# Patient Record
Sex: Male | Born: 1935 | Race: White | Hispanic: No | Marital: Married | Smoking: Former smoker
Health system: Southern US, Community
[De-identification: ages and names within clinical notes are randomized; demographics above are authoritative.]

## PROBLEM LIST (undated history)

## (undated) DIAGNOSIS — I499 Cardiac arrhythmia, unspecified: Secondary | ICD-10-CM

## (undated) DIAGNOSIS — R233 Spontaneous ecchymoses: Secondary | ICD-10-CM

## (undated) DIAGNOSIS — Z972 Presence of dental prosthetic device (complete) (partial): Secondary | ICD-10-CM

## (undated) DIAGNOSIS — R238 Other skin changes: Secondary | ICD-10-CM

## (undated) DIAGNOSIS — H35341 Macular cyst, hole, or pseudohole, right eye: Secondary | ICD-10-CM

## (undated) DIAGNOSIS — J449 Chronic obstructive pulmonary disease, unspecified: Secondary | ICD-10-CM

## (undated) DIAGNOSIS — M1990 Unspecified osteoarthritis, unspecified site: Secondary | ICD-10-CM

## (undated) HISTORY — PX: TOTAL KNEE ARTHROPLASTY: SHX125

## (undated) HISTORY — PX: CATARACT EXTRACTION: SUR2

## (undated) HISTORY — PX: PARS PLANA VITRECTOMY W/ REPAIR OF MACULAR HOLE: SHX2170

## (undated) HISTORY — DX: Presence of dental prosthetic device (complete) (partial): Z97.2

## (undated) HISTORY — DX: Other skin changes: R23.8

## (undated) HISTORY — DX: Chronic obstructive pulmonary disease, unspecified: J44.9

## (undated) HISTORY — DX: Macular cyst, hole, or pseudohole, right eye: H35.341

## (undated) HISTORY — PX: BACK SURGERY: SHX140

## (undated) HISTORY — DX: Spontaneous ecchymoses: R23.3

## (undated) HISTORY — PX: EYE SURGERY: SHX253

## (undated) HISTORY — DX: Unspecified osteoarthritis, unspecified site: M19.90

---

## 2001-08-20 ENCOUNTER — Encounter: Admission: RE | Admit: 2001-08-20 | Discharge: 2001-08-20 | Payer: Self-pay | Admitting: Urology

## 2001-08-20 ENCOUNTER — Encounter: Payer: Self-pay | Admitting: Urology

## 2001-08-26 ENCOUNTER — Ambulatory Visit (HOSPITAL_BASED_OUTPATIENT_CLINIC_OR_DEPARTMENT_OTHER): Admission: RE | Admit: 2001-08-26 | Discharge: 2001-08-26 | Payer: Self-pay | Admitting: Urology

## 2001-08-26 ENCOUNTER — Encounter: Payer: Self-pay | Admitting: Urology

## 2001-08-28 ENCOUNTER — Emergency Department (HOSPITAL_COMMUNITY): Admission: EM | Admit: 2001-08-28 | Discharge: 2001-08-28 | Payer: Self-pay | Admitting: Emergency Medicine

## 2001-10-19 ENCOUNTER — Ambulatory Visit (HOSPITAL_COMMUNITY): Admission: RE | Admit: 2001-10-19 | Discharge: 2001-10-20 | Payer: Self-pay | Admitting: Ophthalmology

## 2003-08-03 ENCOUNTER — Emergency Department (HOSPITAL_COMMUNITY): Admission: EM | Admit: 2003-08-03 | Discharge: 2003-08-04 | Payer: Self-pay | Admitting: Emergency Medicine

## 2003-08-03 ENCOUNTER — Ambulatory Visit (HOSPITAL_COMMUNITY): Admission: RE | Admit: 2003-08-03 | Discharge: 2003-08-03 | Payer: Self-pay | Admitting: Urology

## 2003-08-15 ENCOUNTER — Ambulatory Visit (HOSPITAL_BASED_OUTPATIENT_CLINIC_OR_DEPARTMENT_OTHER): Admission: RE | Admit: 2003-08-15 | Discharge: 2003-08-15 | Payer: Self-pay | Admitting: Urology

## 2004-01-25 ENCOUNTER — Ambulatory Visit (HOSPITAL_COMMUNITY): Admission: RE | Admit: 2004-01-25 | Discharge: 2004-01-26 | Payer: Self-pay | Admitting: Neurological Surgery

## 2004-01-26 ENCOUNTER — Emergency Department (HOSPITAL_COMMUNITY): Admission: EM | Admit: 2004-01-26 | Discharge: 2004-01-26 | Payer: Self-pay | Admitting: Emergency Medicine

## 2005-01-24 ENCOUNTER — Ambulatory Visit (HOSPITAL_COMMUNITY): Admission: RE | Admit: 2005-01-24 | Discharge: 2005-01-24 | Payer: Self-pay | Admitting: Ophthalmology

## 2006-07-30 ENCOUNTER — Ambulatory Visit: Admission: RE | Admit: 2006-07-30 | Discharge: 2006-07-30 | Payer: Self-pay | Admitting: Family Medicine

## 2008-04-08 ENCOUNTER — Emergency Department (HOSPITAL_COMMUNITY): Admission: EM | Admit: 2008-04-08 | Discharge: 2008-04-08 | Payer: Self-pay | Admitting: Emergency Medicine

## 2008-04-08 ENCOUNTER — Ambulatory Visit: Payer: Self-pay | Admitting: Surgery

## 2008-04-08 ENCOUNTER — Encounter (INDEPENDENT_AMBULATORY_CARE_PROVIDER_SITE_OTHER): Payer: Self-pay | Admitting: Emergency Medicine

## 2008-05-03 ENCOUNTER — Inpatient Hospital Stay (HOSPITAL_COMMUNITY): Admission: RE | Admit: 2008-05-03 | Discharge: 2008-05-09 | Payer: Self-pay | Admitting: Orthopedic Surgery

## 2008-05-29 ENCOUNTER — Encounter: Admission: RE | Admit: 2008-05-29 | Discharge: 2008-06-22 | Payer: Self-pay | Admitting: Orthopedic Surgery

## 2010-04-20 ENCOUNTER — Encounter: Admission: RE | Admit: 2010-04-20 | Discharge: 2010-04-20 | Payer: Self-pay | Admitting: Otolaryngology

## 2011-01-14 NOTE — Op Note (Signed)
[name], [name]                 ACCOUNT NO.:  [id number]   MEDICAL RECORD NO.:  [id number]          PATIENT TYPE:  INP   LOCATION:  1616                         FACILITY:  [facility]   PHYSICIAN:  [name], M.D.    DATE OF BIRTH:  08-11-1931   DATE OF PROCEDURE:  05/03/2008  DATE OF DISCHARGE:                               OPERATIVE REPORT   PREOPERATIVE DIAGNOSIS:  Osteoarthritis right knee.   POSTOPERATIVE DIAGNOSIS:  Osteoarthritis right knee.   PROCEDURE:  Right total knee arthroplasty.   SURGEON:  [name], M.D.   ASSISTANT:  [name], P.A.C.   ANESTHESIA:  General with postop Marcaine pain pump.   ESTIMATED BLOOD LOSS:  Minimal.   DRAIN:  Hemovac times one.   TOURNIQUET TIME:  31 minutes at 300 mmHg.   COMPLICATIONS:  None.   CONDITION:  Stable to recovery room.   BRIEF CLINICAL NOTE:  [name] is a 77 year old male with severe end-  stage arthritis of the right knee with progressively worsening pain and  rapid decrease in function.  He has failed nonoperative management and  presents for total knee arthroplasty.   PROCEDURE IN DETAIL:  After the successful administration of general  anesthetic a tourniquet was placed high on his right thigh and right  lower extremity prepped and draped in the usual sterile fashion.  Extremity was wrapped in Esmarch, knee flexed and tourniquet inflated to  300 mmHg.  Midline incision was made with a 10 blade through  subcutaneous tissue to the level of the extensor mechanism.  A fresh  blade was used to make a medial parapatellar arthrotomy.  Soft tissue  over the proximal medial tibia subperiosteally elevated to the joint  line with the knife and to the semimembranosus bursa with a Cobb  elevator.  Soft tissue laterally is elevated with attention being paid  to avoid the patellar tendon on tibial tubercle.  The patella subluxed  laterally, knee flexed 90 degrees, ACL and PCL removed.  Drill was used  to  create a starting hole in the distal femur and the canal was  thoroughly irrigated.  The 5 degree right valgus alignment guide is  placed referencing off the posterior condyles.  Rotation was marked and  the block pinned to remove 11 mm off the distal femur.  I took 11  because of preoperative flexion contracture.  Distal femoral resection  is made with an oscillating saw.  Sizing block was placed, size 5 is the  most appropriate.  Rotation was marked off the epicondylar axis.  Size 5  cutting block is placed and the anterior posterior and chamfer cuts  made.   The tibia was subluxed forward and the menisci removed.  Extramedullary  tibial alignment guide is placed referencing proximally at the medial  aspect of the tibial tubercle and distally along the second metatarsal  axis and tibial crest.  The block is pinned to remove about 2 mm off the  more deficient medial side.  The resection is made with an oscillating  saw.  Size 4 was the most appropriate tibial  size and the proximal tibia  prepared with the modular drill and keel punch for a size 4.  Femoral  preparation is completed with the intercondylar cut for the size 5.   Size 4 mobile bearing tibial trial, size 5 posterior stabilized femoral  trial and a 12.5 mm posterior stabilized rotating platform insert trial  are placed.  With the 12.5 there was a little bit of varus-valgus play  and I went to 15 which allowed for full extension with excellent varus-  valgus anterior and posterior stability throughout full range of motion.  The patella was then everted, thickness measured to be 27 mm.  Freehand  resection taken to 15 mm, 41 template is placed, lug holes were drilled,  trial patella was placed and it tracks normally.  Osteophytes removed  off the posterior femur with the trial in place.  All trials were  removed and the cut bone surfaces are prepared with pulsatile lavage.  Cement was mixed and once ready for implantation the  size 4 mobile  bearing tibial tray, size 5 posterior stabilized femur and the 41  patella were cemented into place.  The patella is held with a clamp.  Trial 15 mm insert was placed and knee held in full extension and all  extruded cement removed.  When the cement had fully hardened then the  permanent 15 mm posterior stabilized rotating platform insert is placed  into the tibial tray.  Wound was copiously irrigated with saline  solution and a thorough synovectomy performed.  FloSeal was injected on  the posterior capsule, medial and lateral gutters and suprapatellar  area.  Moist sponge is placed and tourniquet released for a total time  of 31 minutes.  The sponge was held for 2 minutes then removed.  There  is a slight amount of bleeding and bleeding points were stopped with  electrocautery.  Due to the hypertrophic synovitis I felt that he would  be at risk for some drainage postop so we placed a Hemovac drain and  closed the arthrotomy with interrupted #1 Vicryl.  Flexion against  gravity is 135 degrees.  Subcu was closed with interrupted 2-0 Vicryl  and subcuticular running 4-0 Monocryl.  Catheter for the Marcaine pain  pump is placed and the pump was initiated.  Steri-Strips and a bulky  sterile dressing are applied and he is placed into a knee immobilizer,  awakened and transported to recovery in stable condition.      [name], M.D.  Electronically Signed     FA/MEDQ  D:  05/03/2008  T:  05/04/2008  Job:  [id number]

## 2011-01-14 NOTE — H&P (Signed)
NAMEDOHN, STCLAIR NO.:  [id number]   MEDICAL RECORD NO.:  [id number]          PATIENT TYPE:  INP   LOCATION:                               FACILITY:  [facility]   PHYSICIAN:  Ollen Gross, M.D.    DATE OF BIRTH:  1930/12/23   DATE OF ADMISSION:  05/03/2008  DATE OF DISCHARGE:                              HISTORY & PHYSICAL   CHIEF COMPLAINT:  Right knee pain.   HISTORY OF PRESENT ILLNESS:  The patient is 77 year old male that has  been seen by Dr. Lequita Halt for evaluation of his right knee.  He has had  severe right knee pain for several months now. He is found in the office  to have severe end-stage arthritis, tricompartmental the right knee,  felt to be a good candidate.  Risks and benefits have been discussed.  He has been seen preoperatively by Dr. Jeannetta Nap and felt cleared for  surgery.   ALLERGIES:  SULFA DRUGS.   CURRENT MEDICATIONS:  Motrin, hydrocodone, Symbicort, Protonix.   PAST MEDICAL HISTORY:  Vertigo, macular degeneration, cataracts, severe  COPD, hemorrhoids, history of renal calculi, and childhood illnesses of  measles and mumps.   PAST SURGICAL HISTORY:  Macular hole surgery, back surgery, cataract  surgery, lithotripsy, cystoscopy, cartilage knee surgery.   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  Lives with family.  Wife will be assisting care after  surgery.   FAMILY HISTORY:  Father deceased, old age, 52.  Mother deceased of old  age 55.   REVIEW OF SYSTEMS:  GENERAL:  No fevers, chills or night sweats.  NEURO:  Little bit of blurred vision.  No seizures, syncope or paralysis.  RESPIRATORY: Severe COPD.  No shortness of breath at rest.  No  productive cough.  CARDIOVASCULAR: No chest pain, orthopnea.  GI: No  nausea, vomiting, diarrhea or constipation.  GU: A little bit of  nocturia.  No dysuria, hematuria.  MUSCULOSKELETAL: Right knee.   PHYSICAL EXAMINATION:  VITAL SIGNS: Pulse 76,  respirations 16, blood  pressure 144/72.  GENERAL:  A 76-year white male, well nourished, well developed, slightly  overweight, no acute distress, alert, oriented, and cooperative.  HEENT: Normocephalic, atraumatic.  Pupils are reactive.  Oropharynx  clear.  EOMs intact.  NECK:  Supple.  No bruits.  CHEST:  Faint, inspiratory wheeze, right base, otherwise clear.  HEART: Regular rate and rhythm.  No murmur, S1, S2 noted.  ABDOMEN:  Soft, nontender.  Bowel sounds present.  RECTAL/BREAST/GENITALIA:  Not done not per his present illness.  EXTREMITIES:  Right knee moderate effusion, 10-115 moderate crepitus,  tender more medial than lateral.   IMPRESSION:  Osteoarthritis right knee.   PLAN:  The patient was admitted to the hospital to undergo a right total  knee surgery.  It is to be performed by Dr. Ollen Gross.      [name], P.A.C.      Ollen Gross, M.D.  Electronically Signed    ALP/MEDQ  D:  05/02/2008  T:  05/03/2008  Job:  [id number]  cc:   [name], M.D.  Fax: [phone]   Aundra Dubin, M.D.  [address]  [place]  [place] [address]

## 2011-01-14 NOTE — Discharge Summary (Signed)
[name], [name]                 ACCOUNT NO.:  [id number]   MEDICAL RECORD NO.:  [id number]          PATIENT TYPE:  INP   LOCATION:  1539                         FACILITY:  [facility]   PHYSICIAN:  Ollen Gross, M.D.    DATE OF BIRTH:  20-Apr-1931   DATE OF ADMISSION:  05/03/2008  DATE OF DISCHARGE:  05/09/2008                               DISCHARGE SUMMARY   ADMISSION DIAGNOSES:  1. Osteoarthritis right knee.  2. Vertigo.  3. Macular degeneration.  4. Cataracts.  5. Severe chronic obstructive pulmonary disease.  6. Hemorrhoids.  7. History of renal calculi.  8. Childhood illnesses to include measles and mumps.   DISCHARGE DIAGNOSES:  1. Osteoarthritis right knee status post right total knee replacement      arthroplasty.  2. Postop blood loss anemia.  Did not require transfusion.  3. Mild postop hyponatremia.  4. Ear pain, likely due to otitis infection.  5. Vertigo.  6. Macular degeneration.  7. Cataracts.  8. Severe chronic obstructive pulmonary disease.  9. Hemorrhoids.  10.History of renal calculi.  11.Childhood illnesses to include measles and mumps.   PROCEDURE:  May 03, 2008 right total knee.  Surgeon, Dr. Lequita Halt.  Assistant, Ricardo Peace, PA-C.   ANESTHESIA:  General.   CONSULTANT:  [facility] Dr. Tamsen Roers.   BRIEF HISTORY:  [name] is a 77 year old male with severe end-stage  arthritis of the right knee with progressive worsening pain, rapid  decrease in function, operative management now presents for a total knee  arthroplasty.   LABORATORY DATA:  CBC showed hemoglobin of 14.7, hematocrit of 44.7,  white cell count 9.3, platelets 376,000.  Chem panel on admission all  within normal limits.  PT/INR 13.4 with a PTT of 28.  Preop UA was  negative.  Serial CBCs were followed.   HOSPITAL COURSE:  Hemoglobin did drop down to 12 and then 9.2.  Last H&H  was 8.6 and 25.8.  Serial pro time followed.  PT/INR 27.6 and 2.4.  Serial BMET followed.  Sodium did  drop a little bit down to 139 and got  as low as 133, just below the normal, likely due to the dilutional  component from the fluids.   X-RAYS:  Chest x-ray aut 25, 2009 no active disease.  He had a CT scan  maxillofacial without contrast, unremarkable CT examination of the  facial bones.  EKG April 25, 2008 normal sinus rhythm, sinus  arrhythmia, normal EKG, unconfirmed.   HOSPITAL COURSE:  The patient was admitted to [facility] and  tolerated seizure well, later transferred to recovery then the  orthopedic floor.  Started on PCA and p.o. analgesic pain control  following surgery, doing pretty well on the morning of day one.  He was  noted to have a lot of inflammatory disease in the knee and actually  felt a little bit better on day one.  Hemoglobin was stable.  Had decent  output, monitored his Is and Os.  He had severe COPD with his O2 sats  looked good on day 1, started getting up out of bed.  By day 2 he was up  walking about 40 feet.  He had been sleeping okay.  Seen on the morning  of day 2 he asked to leave the Foley a little bit longer so we left it  in until that evening.  DC the PCA.  He was having trouble starting with  flatus and moving his bowels so put on a little low dose Reglan and also  tried suppositories.  Hemoglobin is down to 9.8 a little bit.  Was  asymptomatic with this so we added iron.  He had a little bit low sodium  so we DC the fluids.  This was felt to be due to just some delusional  component, let him concentrate back up.  On the evening of day 2 he had  been complaining of some ear pain.  He had a general anesthesia and a  little bit of sore throat, and we felt that sore throat was causing a  little bit of swelling and negative pressure.  We put him on a little  bit of decongestant also Cepacol lozenges and throat sprays.  The pain  became quite severe in the ears, so with possibility of having some  irritation in his ear, we put him on a  little bit of topical analgesic  drops for his ears, which did help temporarily.  He was seen on the  morning of day 3 and his main complaint was just the ear pain.  He did  have a bowel movement with the medications we had started him on.  Rechecked his white count the following day.  White count was up a  little bit to 17.4 and then came back down to 11 and even further down  to 10.6.  Due to continued ear pain over the weekend, consult was called  in and the patient was seen by Dr. Tamsen Roers for [facility] on September 6,  over the weekend.  Started him on IV Rocephin and put him on p.o.  Augmentin for coverage.  Due to the G pain that was extending from the  ear to the jaw, it was painful for him to open up his jaw, so they  wanted to rule out any kind of TMJ pathology.  CT scan was ordered.  CT  scan was negative, unremarkable for any of the facial bones and TMJ  pathology.  After the Rocephin and p.o. antibiotics, he was doing better  with his ear pain which continued to improve over the weekend.  Continued to receive therapy.  He is up walking about 125 feet by postop  day #5.  He was seen on rounds on postop day #6 of May 09, 2008,  seen by Dr. Lequita Halt.  His ear pain was better.  Leg was doing okay.  Was  up ambulating and it was decided the patient could be discharged home at  that time.  We would send home on antibiotics.   DISCHARGE/PLAN:  1. The patient discharged home on May 09, 2008.  2. Discharge diagnoses, please see above.  3. Discharge meds, Coumadin, Nu-Iron, Percocet, Robaxin and Augmentin.  4. Diet as tolerated.  5. Activities:  Weightbearing as tolerated to the right lower      extremity.  Gait training, ambulation, and ADLs as per PT for home      therapy. Range of motion strengthening exercises.  Follow up in 2      weeks.   DISPOSITION:  Home.   CONDITION ON DISCHARGE:  Slowly  improving.      [name], P.A.C.      Ollen Gross,  M.D.  Electronically Signed    ALP/MEDQ  D:  05/09/2008  T:  05/09/2008  Job:  [id number]   cc:   Ollen Gross, M.D.  Fax: [phone]   [name], M.D.  Fax: [phone]   Aundra Dubin, M.D.  [address]  [place]  [place] [address]   Beckey Rutter, MD

## 2011-01-14 NOTE — Consult Note (Signed)
NAMEHERVEY, [name]                 ACCOUNT NO.:  [id number]   MEDICAL RECORD NO.:  [id number]          PATIENT TYPE:  INP   LOCATION:  1539                         FACILITY:  [facility]   PHYSICIAN:  Beckey Rutter, MD  DATE OF BIRTH:  12-Jul-1931   DATE OF CONSULTATION:  DATE OF DISCHARGE:                                 CONSULTATION   REFERRING PHYSICIAN:  Primary orthopedic physician is Ollen Gross,  M.D.  Consultation was called by Dr. Lequita Halt.   REASON FOR CONSULTATION:  Left ear pain with inability to open mouth.   HISTORY OF PRESENT ILLNESS:  This is a 77 year old pleasant Caucasian  male with past medical history significant for COPD, renal calculi,  right knee osteoarthropathy, status post knee replacement, and  hemorrhoids, who was complaining of left ear pain and pain whenever he  opens his mouth since Friday.  Friday the pain was so severe that he  could barely eat.  The pain has eased the next day but today the pain is  back to become severe again.  He denied any ear discharge but he  admitted to having some tenderness when he touched his left ear.  He  also has tenderness whenever he opens his mouth and that limits his p.o.  intake greatly.  No fever and no left ear discharge.   PAST MEDICAL HISTORY:  1. Osteoarthritis on right knee.  2. Renal calculi.  3. COPD.  4. Hemorrhoids.   MEDICATION ALLERGIES:  SULFA.   CURRENT MEDICATIONS:  1. Cymbalta b.i.d.  2. Intravenous fluid D5 with NS.  3. Colace 100 mg b.i.d.  4. Nu-Iron 150 mg p.o. daily.  5. Protonix 40 mg b.i.d.  6. Coumadin 1 mg p.o. once.  7. Auralgan ear drops.  8. Tylenol 325 mg p.r.n.  9. Cepacol 1 __________ p.r.n.  10.Robaxin 500 mg p.o. q.6 h. p.r.n.  11.Zofran 4 mg IV q.6 h. p.r.n.  12.Percocet 1-2 tabs p.o. q.4 h. p.r.n.  13.Phenergan 25 mg p.o. q.6 h. and 12.5 mg IV q.6 h. p.r.n.  14.Ambien 5 mg p.o. q.h.s.   SOCIAL HISTORY:  Married, Medical illustrator.  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  No  alcohol abuse.  Lives with his family.   FAMILY HISTORY:  Father deceased at age or 56.  Mother deceased at age  of 27.   REVIEW OF SYSTEMS:  No fever, no discharge.  No dizziness or confusion.  The rest of the review of systems is unremarkable.   EXAMINATION:  Vitals showing 97.5, pulse 94, respiratory rate of 16,  blood pressure 136/69, and oxygen saturation 96.  Head atraumatic, normocephalic.  Left ear is tender to palpation.  Point  of maximum tenderness in the left tragus area.  With otoscope, there is  no significant discharge.  There is no significant bulging of the  eardrum, but there is a redness in the external meatus.  There is  tenderness over the temporomandibular joint area and slight tenderness  on the left mastoid and the left sternoclavicular muscle before the  attachment to the left mastoid.  Otherwise the neck is  supple.  He is able to move it from one side to  other side.  LUNGS:  Bilateral fair air entry.  PRECORDIUM:  First and second heart sound audible, no other sound.  ABDOMEN:  Soft, nontender.  Bowel sounds present.  EXTREMITIES:  No lower extremity edema.  NEUROLOGIC:  Alert and oriented x3.  Moving his extremities  spontaneously.   LABORATORY DATA:  His INR is 2.8.  CBC is showing white blood count is  11.4, no differential, hemoglobin 8.4, hematocrit is 25.1 and platelet  count is 299.  Chest x-ray on April 25, 2008, was showing no acute  disease.   ASSESSMENT AND PLAN:  This is a 77 year old with left temporomandibular  pain with movement and left ear pain.  This could be secondary to  temporomandibular joint pathology but it could be also simple otitis  media with complication.  There is no fever and no significant white  blood count.  Nevertheless, I think the patient would benefit from  antibiotic and ENT consultation.   So the suggestion is:  1. Antibiotic Rocephin with a stat dose.  2. CT head with extension toward the neck to cover the  TMJs and the      mastoids.  3. ENT consultation.  4. The patient is on Coumadin.  I would agree with discharging the      patient on Coumadin.  5. Anemia.  I am not sure why the patient is anemic.  Currently I      would recommend anemia panel.  The patient is already taking Nu-      Iron.   Thank you for the consultation.  We will follow through with you.      Beckey Rutter, MD  Electronically Signed     EME/MEDQ  D:  05/07/2008  T:  05/08/2008  Job:  [id number]

## 2011-01-17 ENCOUNTER — Other Ambulatory Visit: Payer: Self-pay | Admitting: Family Medicine

## 2011-01-17 DIAGNOSIS — R109 Unspecified abdominal pain: Secondary | ICD-10-CM

## 2011-01-17 NOTE — Op Note (Signed)
NAMETREYSON, AXEL NO.:  [id number]   MEDICAL RECORD NO.:  [id number]          PATIENT TYPE:  OIB   LOCATION:  2899                         FACILITY:  [facility]   PHYSICIAN:  Guadelupe Sabin, M.D.DATE OF BIRTH:  June 17, 1931   DATE OF PROCEDURE:  01/24/2005  DATE OF DISCHARGE:                                 OPERATIVE REPORT   PREOPERATIVE DIAGNOSES:  1.  Retained intraocular foreign body (silicone oil), right eye.  2.  Macular hole, right eye.  3.  Pseudophakia, right eye.   POSTOPERATIVE DIAGNOSES:  1.  Retained intraocular foreign body (silicone oil), right eye.  2.  Macular hole, right eye.  3.  Pseudophakia, right eye.   NAME OF OPERATION:  Pars plana vitrectomy using vitreous infusion suction  cutter.   SURGEON:  Guadelupe Sabin, M.D.   ASSISTANT:  Nurse.   ANESTHESIA:  Local 4% Xylocaine, 0.75% Marcaine retrobulbar block, topical  tetracaine, intraocular Xylocaine.   OPERATIVE PROCEDURE:  After the patient was prepped and draped, a lid  speculum was inserted in the right eye.  The eye was turned downward and a  superior rectus traction suture placed.  A peritomy was performed adjacent  to the limbus from the  8 to 2 o'clock position.  Three sclerotomy sites  were prepared 3 mm from the limbus using the 19-gauge MVR blade.  The  vitreous infusion terminal, 4-mm length, was instilled at the 8 o'clock  position and held in placed with a 5-0 green mattress Dacron suture.  The  fiberoptic light pipe was inserted at the 2 o'clock position and the  handpiece of the vitreous infusion suction cutter at the 10 o'clock  position.  Slow vitreous infusion and suction cutting were begun and the eye  allowed to irrigate with the infusing balanced salt solution.  Indirect  ophthalmoscopy failed to reveal the large previous bubble of silicone noted  that the 10 o'clock position.  It was noted, however, that there were  multiple small bubbles in the anterior  chamber.  It  was then elected to  take the 20-gauge MVR blade for an incision at the 11 o'clock position into  the peripheral cornea and anterior chamber.  The handpiece of the vitreous  infusion suction cutter was then instilled and the silicone bubbles removed  from the anterior chamber.  The incision was temporarily closed with a 10-0  interrupted nylon suture.  Attention was then once again paid to the  posterior chamber where a continued posterior vitrectomy was performed.  It  was noted that there was a small bubble of blood on the retinal surface and  this was aspirated with the silicone-tipped brush applicator.  A large  macular hole appeared to be present.  No attempt was made to repair the  macular hole at this time, as the patient understood this preoperatively and  had had previous failed surgery.  The instruments were then withdrawn and  the peripheral fundus again inspected with peripheral scleral depression and  indirect ophthalmoscopy.  No residual silicone bubbles were seen.  It was  then  elected to close.  The sclerotomy sites were closed 7-0 interrupted  Vicryl sutures and the conjunctiva closed with a running 7-0 Vicryl suture.  Depo-Garamycin  was instilled in the conjunctival cul-de-sac and Maxitrol and atropine  ointment instilled in the conjunctival cul-de-sac.  A light patch and  protective shield were applied.  Duration of procedure -- 1 hour.  The  patient tolerated the procedure well in general and left the operating room  for the recovery room in good condition.      HNJ/MEDQ  D:  01/24/2005  T:  01/24/2005  Job:  [id number]

## 2011-01-17 NOTE — H&P (Signed)
[name], [name] NO.:  [id number]   MEDICAL RECORD NO.:  [id number]          PATIENT TYPE:  OIB   LOCATION:  2899                         FACILITY:  [facility]   PHYSICIAN:  Guadelupe Sabin, M.D.DATE OF BIRTH:  [birth year]   DATE OF ADMISSION:  01/24/2005  DATE OF DISCHARGE:                                HISTORY & PHYSICAL   This was a planned outpatient surgical admission of this 77 year old white  male admitted for removal of intraocular foreign body (silicone oil) right  eye.   PRESENT ILLNESS:  This patient had previous cataract implant surgery  performed on both eyes.  He was seen in my office in February 2003 and found  to have a large macular hole of the right eye.  The patient was transferred  to Dr. Mina Marble, chairman of the Department of Ophthalmology at  [facility] where a posterior vitrectomy was  performed with instillation of silicone oil.  The silicone oil was later  removed.  Recently, however, the patient has noted the sudden onset of  floaters in the operated eye and was found to have some residual silicone  bubbles.  These were frustrating to the patient and it was suggested that  these be removed with repeat vitrectomy surgery.  The patient was encouraged  to go back to Dr. Sharyl Nimrod but he wished to have the surgery performed in  [place].  Arrangements were made for his outpatient admission at this  time.   PAST MEDICAL HISTORY:  Patient is under the care of Dr. Jeannetta Nap of [facility].  He is said to be in good condition, allergic to sulfa, and is a low  risk for the surgical procedure performed under local anesthesia.   PHYSICAL EXAMINATION:  VITAL SIGNS:  As recorded on admission:  Blood  pressure 125/79, pulse 72, respirations 16, temperature 98.5.  GENERAL APPEARANCE:  Patient is a pleasant well-nourished, well-developed  white male in no acute distress.  HEENT:  Eyes:  Visual acuity 20/400  right eye, 20/25 left eye.  Applanation  tonometry 16 mm right eye, 22 left eye.  Slit lamp examination shows the eye  to be white and clear with a clear cornea, deep and clear anterior chamber,  and a posterior chamber implant.  Behind the implant is vitreous  condensations and a small bubble of silicone oil.  Optic nerve blood vessels  appear normal.  A large macular hole still remains open approximately one  disc diameter size.  Peripherally there are cryo scars from possible retinal  tear repair.  CHEST/LUNGS:  Clear to percussion and auscultation.  HEART:  Normal sinus rhythm.  No cardiomegaly.  No murmurs.  ABDOMEN:  Negative.  EXTREMITIES:  Negative.   ADMISSION DIAGNOSIS:  Intraocular foreign body (retained silicone oil),  pseudophakia, retinal macular hole.   SURGICAL PLAN:  Posterior vitrectomy to remove retained silicone intraocular  foreign body.      HNJ/MEDQ  D:  01/24/2005  T:  01/24/2005  Job:  [id number]   cc:   Molly Maduro L. Dione Booze, M.D.  [phone]  [address]  [place]  [place] [address]  Fax: [phone]   [name], M.D.  [address]  [place], [place] [address]  Fax: [phone]

## 2011-01-17 NOTE — H&P (Signed)
[place]  Patient:    [name], [name] Visit Number: [id number] MRN: [id number]          Service Type: DSU Location: RCRM 2550 04 Attending Physician:  Ivor Messier Dictated by:   Guadelupe Sabin, M.D. Admit Date:  10/19/2001   CC:         Molly Maduro L. Dione Booze, M.D.  Hadassah Pais. Jeannetta Nap, M.D.   History and Physical  REASON FOR ADMISSION:  This was a planned outpatient surgical admission of this 77 year old white male admitted for vitreous surgery of a retinal macular hole of his right eye.  PRESENT ILLNESS:  This patient was in good visual health until recently when he noted the sudden loss of central vision in his right eye.  The patient was seen by Dr. Molly Maduro L. Groat and found to have a retinal macular hole with vision decreased to finger-counting.  The patient was referred to my office where this diagnosis was confirmed and arrangements made for surgery.  Patient was given oral discussion and printed information concerning the procedure and its possible complications with also face-down postoperative positioning stress.  The patient was reluctant to proceed initially with scheduling the surgery.  Later, he called back to the office and underwent fluorescein angiography and scheduling of the surgery at this time.  He signed an informed consent.  PAST MEDICAL HISTORY:  The patient is in stable general health under the care of Dr. Hadassah Pais. [place] in [employer].  CURRENT MEDICATIONS:  Motrin and Bextra for chronic arthritis.  ALLERGIES:  SULFA.  REVIEW OF SYSTEMS:  No cardiorespiratory complaints.  PHYSICAL EXAMINATION:  GENERAL:  Patient is a healthy, alert, 77 year old white male in acute ocular distress.  HEENT:  Eyes:  Ocular exam as noted above.  CHEST:  Lungs clear to percussion and auscultation.  HEART:  Normal sinus [name].  No cardiomegaly.  No murmurs.  ABDOMEN:  Negative.  EXTREMITIES:   Negative.  ADMISSION DIAGNOSIS:  Retinal macular hole, right eye.  SURGICAL PLAN:  Posterior vitrectomy through pars plana with membrane peeling and vitreous air-gas exchange. Dictated by:   Guadelupe Sabin, M.D. Attending Physician:  Ivor Messier DD:  10/19/01 TD:  10/19/01 Job: [id number]

## 2011-01-17 NOTE — Op Note (Signed)
[name], [name]                           ACCOUNT NO.:  [id number]   MEDICAL RECORD NO.:  [id number]                   PATIENT TYPE:  AMB   LOCATION:  NESC                                 FACILITY:  [facility]   PHYSICIAN:  Excell Seltzer. Annabell Howells, M.D.                 DATE OF BIRTH:  07-27-1931   DATE OF PROCEDURE:  08/15/2003  DATE OF DISCHARGE:  08/15/2003                                 OPERATIVE REPORT   PROCEDURE:  Right ureteroscopic stone extraction.   PREOPERATIVE DIAGNOSIS:  Right ureteral stone.   POSTOPERATIVE DIAGNOSIS:  Right ureteral stone.   SURGEON:  Excell Seltzer. Annabell Howells, M.D.   ANESTHESIA:  General.   COMPLICATIONS:  None.   INDICATIONS:  [name] is a 77 year old white male with a history of  stones, who had a right distal ureteral stone.  It was initially treated  with lithotripsy.  He failed to have good fragmentation and now is undergo  ureteroscopy.   FINDINGS AND PROCEDURE:  The patient was taken to the operating room,  received p.o. Cipro.  General anesthetic was induced.  He was placed in  lithotomy position.  His perineum and genitalia were prepped with Betadine  solution.  He was draped in the usual sterile fashion.  The 6 French short  ureteroscope was passed per urethra.  I was able to cannulate the right  ureteral orifice without difficulty.  The scope was used to dilate the  ureter.  The stone was grasped and was removed without difficulty.  The 22  French cystoscope was then inserted, and the bladder was drained.  There was  not felt to be a need for stent.  At this point, the patient taken down from  lithotomy position; his anesthetic was reversed, and he was moved to the  recovery room in stable condition.                                               Excell Seltzer. Annabell Howells, M.D.    JJW/MEDQ  D:  08/30/2003  T:  08/30/2003  Job:  [id number]

## 2011-01-17 NOTE — Op Note (Signed)
[place]  Patient:    [name], [name] Visit Number: [id number] MRN: [id number]          Service Type: DSU Location: RCRM 2550 04 Attending Physician:  Ivor Messier Dictated by:   Guadelupe Sabin, M.D. Proc. Date: 10/19/01 Admit Date:  10/19/2001   CC:         Molly Maduro L. Dione Booze, M.D.  Hadassah Pais. Jeannetta Nap, M.D.   Operative Report  PREOPERATIVE DIAGNOSIS:  Retinal macular hole, right eye.  POSTOPERATIVE DIAGNOSIS:  Retinal macular hole, right eye.  OPERATION:  Pars plana vitrectomy using vitreous infusion suction cutter with membrane peeling and excision, endolaser photocoagulation.  SURGEON:  Guadelupe Sabin, M.D.  ASSISTANT:  Nurse.  ANESTHESIA:  General.  OPHTHALMOSCOPY:  As previously noted.  It appeared that the retinal macular hole had enlarged since the patient was last seen in the office.  DESCRIPTION OF PROCEDURE:  After the patient was prepped and draped, the lid speculum was inserted in the right eye.  The eye was turned downward and a superior rectus traction suture placed.  Three sclerotomy sites were prepared after a conjunctival peritomy had been performed.  These sites were located at the 8:30, 10 and 2 oclock position, 3.5 mm from the limbus.  These were made with the MVR blade.  The 4-mm vitreous infusion terminal was secured in place at the 8:30 position with a 5-0 green Mersilene suture.  The tip could be seen projecting into the vitreous cavity.  The fiberoptic lightpipe was inserted at the 2 oclock position and the handpiece of the vitreous infusion suction cutter at the 10 oclock position.  Slow vitreous infusion suction cutting were then begun from an anterior-to-posterior direction toward the retina. Fibrinous vitreous was noted behind the lens in the anterior vitreous.  As the retina was approached, the retinal macular hole was visualized and found to be slightly larger than seen in the office.  Using  the Grizzard suction cannula, the cannula was passed over the surface of the retina.  There appeared to be a thin layer and membrane adjacent to the retinal macular hole and covering the entire posterior retina.  This was gradually aspirated with the Grizzard cannula.  There was some pulling and tugging on the macular hole as the membrane was being peeled and a small amount of blood was seen on the temporal edge of the macular hole, which was slightly distorted.  An additional area of retinal ganglion cell exposure as a cotton tuft was noted inferior to the macula and this was treated with endolaser photocoagulation.  No definite retinal hole was present.  Following the removal of the membrane up into the mid-vitreous, it was then aspirated and cut with the vitreous infusion suction cutter.  A vitreous air exchange was then carried out using the [place] optical aspirator, aspirating the vitreous fluid to the retinal surface by aspirating in the cup of the optic nerve.  The vitreous instruments were then withdrawn and the 2 oclock sclerotomy site closed and the 10 oclock sclerotomy site suture passed through its opening.  The air which filled the vitreous cavity was then exchanged for 10% C3-F8.  The remaining sclerotomy sites were then closed.  The eye was slightly hypotonus and a small amount of additional C3-F8 was injected through a 30-gauge needle through the pars plana into the vitreous cavity, reestablishing the intraocular pressure.  Schiotz tonometry was then recorded at 5 scale units with a 5.5 g weight, indicating  a normotensive eye.  The conjunctiva was pulled forward and closed with a running 7-0 Vicryl suture.  Depo-Garamycin and Celestone were injected in the subtenon space inferiorly.  A light patch and protective shield were applied to the operated right eye.  Duration of procedure -- one hour.  Patient tolerated the procedure well in general and left the operating room for  the recovery room on his left side.  The patient was to assume face-down positioning when alert, as this was important for the success of the operation. Dictated by:   Guadelupe Sabin, M.D. Attending Physician:  Ivor Messier DD:  10/19/01 TD:  10/19/01 Job: [id number]

## 2011-01-17 NOTE — Op Note (Signed)
NAME:  [name], [name]                           ACCOUNT NO.:  [id number]   MEDICAL RECORD NO.:  [id number]                   PATIENT TYPE:  OIB   LOCATION:  3003                                 FACILITY:  [facility]   PHYSICIAN:  Tia Alert, MD                  DATE OF BIRTH:  07/16/1931   DATE OF PROCEDURE:  01/25/2004  DATE OF DISCHARGE:                                 OPERATIVE REPORT   PREOPERATIVE DIAGNOSIS:  Lumbar spondylosis with spinal stenosis, L3-4 and  L4-5 with bilateral leg pain.   POSTOPERATIVE DIAGNOSIS:  Lumbar spondylosis with spinal stenosis, L3-4 and  L4-5 with bilateral leg pain.   OPERATION PERFORMED:  Decompressive lumbar laminectomy, medial facetectomy  and foraminotomies, L3-4 and L4-5.  Central canal and nerve root  decompression.   SURGEON:  Tia Alert, MD   ASSISTANT:  Reinaldo Meeker, M.D.   ANESTHESIA:  General orotracheal anesthesia.   COMPLICATIONS:  None apparent.   INDICATIONS FOR PROCEDURE:  Mr. Gust is a 77 year old white male who was  referred to the neurosurgical clinic with complaints of bilateral leg pain.  He had very little in the way of back pain.  He had tried medical management  for quite some time without significant relief.  He was on oxycodone for  pain relief along with Motrin.  He had an MRI which showed severe spinal  stenosis with spondylosis and scoliosis at L3-4 and L4-5.  He had had a  previous laminectomy at L4-5 and L5-S1 on the left.  I recommended a lumbar  decompression at L3-4 and L4-5 for the central spinal stenosis and for nerve  root decompression.  He understood the risks, benefits and alternatives and  wished to proceed.   DESCRIPTION OF PROCEDURE:  The patient was taken to the operating room and  after induction of adequate general endotracheal anesthesia, he was rolled  into the prone position on the Wilson frame and all pressure points were  padded.  His lumbar region was prepped with DuraPrep and then  draped in the  usual sterile fashion.  10 mL of local anesthesia was injected and then a  dorsal midline incision was made and carried down to the lumbosacral fascia  which was opened and the paraspinous musculature was taken down in a  subperiosteal fashion to expose the L3-4 and L4-5 interspace.  His  intraoperative x-ray confirmed our level and then the spinous processes were  removed with Leksell rongeur and a laminectomy, medial facetectomy and  bilateral foraminotomy was performed using a combination of a Kerrison punch  and a high speed Black Max air powered drill.  He had quite a bit of  overgrown yellow ligament, especially at L3-4 and this was removed.  I was  very careful to decompress the lateral recesses bilaterally.  I dissected  out to the medial pedicle level at each level and followed  L3, L4 and L5  nerve roots out bilaterally into their respective foramina.  I did  inspect  the disk spaces at both levels and found disk bulges but no significant disk  herniation.  Once the decompression was complete, I dried all bleeding  points with Gelfoam and with Surgifoam and with bipolar cautery I then  irrigated with copious amounts of bacitracin containing saline solution,  then lined the dura with Gelfoam, placed a medium Hemovac drain through a  separate stab incision and closed the fascia with interrupted #1 Vicryl,  closed the subcutaneous and subcuticular tissue with 2-0 and 3-0 Vicryl  and closed the skin with benzoin and Steri-Strips.  Drapes were removed.  Sterile dressing was applied.  The patient was awakened from general  anesthesia and transferred to the recovery room in stable condition.  At the  end of the procedure all sponge, needle and instrument counts were correct.                                               Tia Alert, MD    DSJ/MEDQ  D:  01/25/2004  T:  01/25/2004  Job:  [id number]

## 2011-01-21 ENCOUNTER — Ambulatory Visit
Admission: RE | Admit: 2011-01-21 | Discharge: 2011-01-21 | Disposition: A | Discharge status: Home / Self Care | Payer: Medicare Other | Source: Ambulatory Visit | Attending: Family Medicine | Admitting: Family Medicine

## 2011-01-21 DIAGNOSIS — R109 Unspecified abdominal pain: Secondary | ICD-10-CM

## 2011-01-21 DIAGNOSIS — R10A1 Flank pain, right side: Secondary | ICD-10-CM

## 2011-01-21 MED ORDER — IOHEXOL 300 MG/ML  SOLN
125.0000 mL | Freq: Once | INTRAMUSCULAR | Status: AC | PRN
  Administered 2011-01-21: 125 mL via INTRAVENOUS

## 2011-02-25 ENCOUNTER — Other Ambulatory Visit (INDEPENDENT_AMBULATORY_CARE_PROVIDER_SITE_OTHER): Payer: Self-pay | Admitting: Surgery

## 2011-02-25 ENCOUNTER — Ambulatory Visit (HOSPITAL_COMMUNITY)
Admission: RE | Admit: 2011-02-25 | Discharge: 2011-02-25 | Disposition: A | Discharge status: Home / Self Care | Payer: Medicare Other | Source: Ambulatory Visit | Attending: Surgery | Admitting: Surgery

## 2011-02-25 ENCOUNTER — Encounter (HOSPITAL_COMMUNITY)
Admission: RE | Admit: 2011-02-25 | Discharge: 2011-02-25 | Disposition: A | Discharge status: Home / Self Care | Payer: Medicare Other | Source: Ambulatory Visit | Attending: Surgery | Admitting: Surgery

## 2011-02-25 DIAGNOSIS — K429 Umbilical hernia without obstruction or gangrene: Secondary | ICD-10-CM

## 2011-02-25 DIAGNOSIS — K8020 Calculus of gallbladder without cholecystitis without obstruction: Secondary | ICD-10-CM

## 2011-02-25 DIAGNOSIS — R0602 Shortness of breath: Secondary | ICD-10-CM | POA: Insufficient documentation

## 2011-02-25 DIAGNOSIS — J438 Other emphysema: Secondary | ICD-10-CM | POA: Insufficient documentation

## 2011-02-25 DIAGNOSIS — Z01812 Encounter for preprocedural laboratory examination: Secondary | ICD-10-CM | POA: Insufficient documentation

## 2011-02-25 DIAGNOSIS — Z01818 Encounter for other preprocedural examination: Secondary | ICD-10-CM | POA: Insufficient documentation

## 2011-02-25 LAB — DIFFERENTIAL
Basophils Absolute: 0 10*3/uL (ref 0.0–0.1)
Basophils Relative: 0 % (ref 0–1)
Eosinophils Absolute: 0.2 10*3/uL (ref 0.0–0.7)
Eosinophils Relative: 2 % (ref 0–5)
Monocytes Relative: 11 % (ref 3–12)
Neutrophils Relative %: 68 % (ref 43–77)

## 2011-02-25 LAB — CBC
HCT: 46.2 % (ref 39.0–52.0)
Hemoglobin: 15.5 g/dL (ref 13.0–17.0)
MCHC: 33.5 g/dL (ref 30.0–36.0)
RBC: 5.07 MIL/uL (ref 4.22–5.81)

## 2011-02-25 LAB — COMPREHENSIVE METABOLIC PANEL
ALT: 12 U/L (ref 0–53)
BUN: 13 mg/dL (ref 6–23)
CO2: 31 mEq/L (ref 19–32)
Chloride: 102 mEq/L (ref 96–112)
GFR calc Af Amer: >60 mL/min (ref >60)
GFR calc non Af Amer: >60 mL/min (ref >60)
Potassium: 4 mEq/L (ref 3.5–5.1)
Total Bilirubin: 0.4 mg/dL (ref 0.3–1.2)
Total Protein: 6.2 g/dL (ref 6.0–8.3)

## 2011-02-25 LAB — SURGICAL PCR SCREEN: MRSA, PCR: NEGATIVE

## 2011-02-25 NOTE — Progress Notes (Signed)
Quick Note:  OK for surgery ______

## 2011-02-27 ENCOUNTER — Ambulatory Visit (HOSPITAL_COMMUNITY)
Admission: RE | Admit: 2011-02-27 | Discharge: 2011-02-27 | Disposition: A | Discharge status: Home / Self Care | Payer: Medicare Other | Source: Ambulatory Visit | Attending: Surgery | Admitting: Surgery

## 2011-02-27 ENCOUNTER — Other Ambulatory Visit (INDEPENDENT_AMBULATORY_CARE_PROVIDER_SITE_OTHER): Payer: Self-pay | Admitting: Surgery

## 2011-02-27 ENCOUNTER — Ambulatory Visit (HOSPITAL_COMMUNITY): Payer: Medicare Other

## 2011-02-27 DIAGNOSIS — K8020 Calculus of gallbladder without cholecystitis without obstruction: Secondary | ICD-10-CM | POA: Insufficient documentation

## 2011-02-27 DIAGNOSIS — K8010 Calculus of gallbladder with chronic cholecystitis without obstruction: Secondary | ICD-10-CM

## 2011-02-27 DIAGNOSIS — H3530 Unspecified macular degeneration: Secondary | ICD-10-CM | POA: Insufficient documentation

## 2011-02-27 DIAGNOSIS — Z01818 Encounter for other preprocedural examination: Secondary | ICD-10-CM | POA: Insufficient documentation

## 2011-02-27 DIAGNOSIS — M25519 Pain in unspecified shoulder: Secondary | ICD-10-CM | POA: Insufficient documentation

## 2011-02-27 DIAGNOSIS — Z01810 Encounter for preprocedural cardiovascular examination: Secondary | ICD-10-CM | POA: Insufficient documentation

## 2011-02-27 DIAGNOSIS — J4489 Other specified chronic obstructive pulmonary disease: Secondary | ICD-10-CM | POA: Insufficient documentation

## 2011-02-27 DIAGNOSIS — K219 Gastro-esophageal reflux disease without esophagitis: Secondary | ICD-10-CM | POA: Insufficient documentation

## 2011-02-27 DIAGNOSIS — J449 Chronic obstructive pulmonary disease, unspecified: Secondary | ICD-10-CM | POA: Insufficient documentation

## 2011-02-27 DIAGNOSIS — H548 Legal blindness, as defined in USA: Secondary | ICD-10-CM | POA: Insufficient documentation

## 2011-02-27 DIAGNOSIS — K824 Cholesterolosis of gallbladder: Secondary | ICD-10-CM

## 2011-02-27 DIAGNOSIS — K429 Umbilical hernia without obstruction or gangrene: Secondary | ICD-10-CM | POA: Insufficient documentation

## 2011-02-27 DIAGNOSIS — IMO0002 Reserved for concepts with insufficient information to code with codable children: Secondary | ICD-10-CM | POA: Insufficient documentation

## 2011-02-27 DIAGNOSIS — Z01812 Encounter for preprocedural laboratory examination: Secondary | ICD-10-CM | POA: Insufficient documentation

## 2011-02-28 NOTE — Op Note (Signed)
[name], [name] NO.:  [id number]  MEDICAL RECORD NO.:  [id number]  LOCATION:  SDSC                         FACILITY:  [facility]  PHYSICIAN:  [name], M.D.DATE OF BIRTH:  17-Apr-1931  DATE OF PROCEDURE:  02/27/2011 DATE OF DISCHARGE:  02/27/2011                              OPERATIVE REPORT   PREOPERATIVE DIAGNOSES: 1. Symptomatic cholelithiasis. 2. Umbilical hernia.  POSTOPERATIVE DIAGNOSES: 1. Symptomatic cholelithiasis. 2. Umbilical hernia.  PROCEDURE: 1. Laparoscopic cholecystectomy with intraoperative cholangiogram. 2. Primary repair of umbilical hernia.  SURGEON:  Ricardo Fus A. Anvay Tennis, MD  ANESTHESIA:  General endotracheal anesthesia, 0.25% Sensorcaine local with epinephrine.  ASSISTANT:  Ricardo L. Freida Busman, MD  ESTIMATED BLOOD LOSS:  Less than 10 mL.  SPECIMEN:  Gallbladder to pathology.  DRAINS:  None.  INDICATIONS FOR PROCEDURE:  The patient is a 77 year old male with symptomatic cholelithiasis.  This was picked up on CT scanning and physical exam as well as history.  He also had an umbilical hernia and wished to have repair at the same time.  Risks, benefits, and alternative therapy to surgery were discussed with him about his disease.  Risk of bleeding, infection, common bile duct injury, injury to major vascular structures, injury to diaphragm, abdominal wall, colon, small bowel as well as small bowel obstruction due to adhesions and recurrence of his umbilical hernia, cardiovascular risks, given his advanced age, and pulmonary risks given his history of COPD were all discussed with him preoperatively.  He understood all above potential risks and alternatives of therapy and agreed to proceed with surgical therapy.  DESCRIPTION OF PROCEDURE:  The patient was seen in the holding area. Questions were answered to the best of my ability.  He was then taken back to the operating room.  After induction of general anesthesia,  the abdomen was prepped and draped in sterile fashion.  Time-out was done. He received preoperative Ancef.  A curvilinear incision was made along the base of the umbilicus down to the subcutaneous tissues.  The defect was identified and was about 2 cm.  There was some preperitoneal fat that I reduced back in the abdominal cavity.  Then I used small hemostat to spread up the peritoneal lining into the abdominal cavity without difficulty.  I placed a pursestring suture of 0 Vicryl around this and a 12-mm Hasson cannula was placed through this.  Pneumoperitoneum was created to 15 mmHg of CO2 and laparoscope was placed.  He was placed in reverse Trendelenburg and rolled to his left.  Upon inspection, there were some right lower quadrant adhesions from previous appendectomy, otherwise normal 4-quadrant laparoscopy.  An 11-mm subxiphoid port was placed and two 5-mm ports were placed in the right upper quadrant. Gallbladder was identified, grabbed by its dome, and retracted to the right shoulder.  He was on some prednisone.  The gallbladder wall was thin and a small tear on the gallbladder was noted which I closed with clips.  No stones and a small amount of bile was spilled.  It was not inflamed.  A second grasper was used to grab the infundibulum of the gallbladder and port of the patient's right lower quadrant and opening the  triangle of Calot.  We dissected around the cystic duct and found the junction of the cystic duct and gallbladder.  Clips were placed on the gallbladder side of this.  An anterior branch of the cystic artery was identified and was double clipped and divided.  Small incision was then made in the cystic duct and through a separate stab incision a Cook cholangiogram catheter was placed and the cystic duct held in place by a single clip.  Intraoperative cholangiogram revealed a normal common bile duct, free flow of contrast in the duodenum.  No obstruction, stone,  or stricture.  Free flow of contrast in the common hepatic duct and into right and left hepatic ducts.  We then removed the catheter, placed 3 clips across the cystic duct remnant, and divided it.  The posterior branch of the cystic artery was divided between clips.  The gallbladder was removed with cautery.  It was placed in an EndoCatch bag. Gallbladder bed was found to be hemostatic with cautery and Surgicel SNoW was placed in the gallbladder bed with satisfactory result. Irrigation was suctioned out and was used until clear.  The hemostasis was excellent.  No signs of bleeding or bile leakage.  The gallbladder was then placed in an EndoCatch bag.  We then reinspected the abdomen and all 4 quadrants, saw no signs of injury.  We then extracted the gallbladder in the EndoCatch bag through the umbilicus.  I then closed the fascia with #1 interrupted Novafil sutures and tied the pursestring suture already in place.  The CO2 was allowed to escape and the trocars were removed.  The skin was closed with 4-0 Monocryl.  Dermabond was applied.  All final counts of sponge, needle, and instrument were  found to be correct at this portion of the case.  The patient was awakened, extubated, and taken to recovery in satisfactory condition.     [name], M.D.     TAC/MEDQ  D:  02/27/2011  T:  02/27/2011  Job:  [id number]  cc:   [name], M.D.  Electronically Signed by Harriette Bouillon M.D. on 02/28/2011 07:37:28 AM

## 2011-03-20 ENCOUNTER — Encounter (INDEPENDENT_AMBULATORY_CARE_PROVIDER_SITE_OTHER): Payer: Self-pay | Admitting: General Surgery

## 2011-03-21 ENCOUNTER — Encounter (INDEPENDENT_AMBULATORY_CARE_PROVIDER_SITE_OTHER): Payer: Self-pay | Admitting: Surgery

## 2011-03-21 ENCOUNTER — Ambulatory Visit (INDEPENDENT_AMBULATORY_CARE_PROVIDER_SITE_OTHER): Payer: Medicare Other | Admitting: Surgery

## 2011-03-21 DIAGNOSIS — Z9889 Other specified postprocedural states: Secondary | ICD-10-CM

## 2011-03-21 NOTE — Patient Instructions (Signed)
Return to clinic as needed

## 2011-03-21 NOTE — Progress Notes (Signed)
The patient returns after laparoscopic cholecystectomy cholangiogram and local hernia repair. He is doing well. He has no complaints.  Exam: Incisions are clean dry and intact and well healed without signs of infection. Abdomen soft nontender.  Impression: Status post laparoscopic cholecystectomy cholangiogram and umbilical hernia repair doing well  Plan: Return as needed.

## 2011-05-30 LAB — POCT I-STAT, CHEM 8
BUN: 12
Calcium, Ion: 1.16
Chloride: 101
HCT: 48
Hemoglobin: 16.3
Sodium: 140

## 2011-05-30 LAB — APTT: aPTT: 31

## 2011-05-30 LAB — PROTIME-INR: INR: 1

## 2011-06-04 LAB — CBC
HCT: 25.1 — ABNORMAL LOW
HCT: 25.8 — ABNORMAL LOW
HCT: 27.2 — ABNORMAL LOW
HCT: 29.9 — ABNORMAL LOW
Hemoglobin: 8.4 — ABNORMAL LOW
Hemoglobin: 8.6 — ABNORMAL LOW
Hemoglobin: 9.2 — ABNORMAL LOW
Hemoglobin: 9.8 — ABNORMAL LOW
MCHC: 32.7
MCHC: 33.7
MCV: 91.2
MCV: 91.7
MCV: 92.1
MCV: 93.3
Platelets: 336
RBC: 2.96 — ABNORMAL LOW
RDW: 12.7
RDW: 12.8
RDW: 12.9
RDW: 13.1
WBC: 16.4 — ABNORMAL HIGH

## 2011-06-04 LAB — ABO/RH: ABO/RH(D): O POS

## 2011-06-04 LAB — BASIC METABOLIC PANEL
BUN: 10
BUN: 12
CO2: 30
CO2: 31
Calcium: 8.1 — ABNORMAL LOW
Chloride: 97
Creatinine, Ser: 0.75
Creatinine, Ser: 0.8
GFR calc Af Amer: >60
GFR calc Af Amer: >60
GFR calc non Af Amer: >60
GFR calc non Af Amer: >60
Glucose, Bld: 184 — ABNORMAL HIGH
Potassium: 4.9
Sodium: 139

## 2011-06-04 LAB — IRON AND TIBC
Saturation Ratios: 15 — ABNORMAL LOW
UIBC: 164

## 2011-06-04 LAB — PROTIME-INR
INR: 3.9 — ABNORMAL HIGH
Prothrombin Time: 27.9 — ABNORMAL HIGH

## 2011-06-04 LAB — TYPE AND SCREEN: ABO/RH(D): O POS

## 2011-06-04 LAB — VITAMIN B12: Vitamin B-12: 249 (ref 211–911)

## 2011-06-04 LAB — RETICULOCYTES: RBC.: 2.96 — ABNORMAL LOW

## 2011-06-04 LAB — FOLATE: Folate: 8.5

## 2012-05-17 ENCOUNTER — Other Ambulatory Visit: Payer: Self-pay | Admitting: Family Medicine

## 2012-05-17 ENCOUNTER — Ambulatory Visit
Admission: RE | Admit: 2012-05-17 | Discharge: 2012-05-17 | Disposition: A | Discharge status: Home / Self Care | Payer: Medicare Other | Source: Ambulatory Visit | Attending: Family Medicine | Admitting: Family Medicine

## 2012-05-17 DIAGNOSIS — R52 Pain, unspecified: Secondary | ICD-10-CM

## 2014-05-15 IMAGING — CR DG PELVIS 1-2V
2 series · 2 of 2 positions shown · non-contrast
Comparison: None.

CLINICAL DATA: Right iliac pain radiating to the right hip and
right leg with no injury

PELVIS - 1-2 VIEW

[view not recorded (1 of 2)]
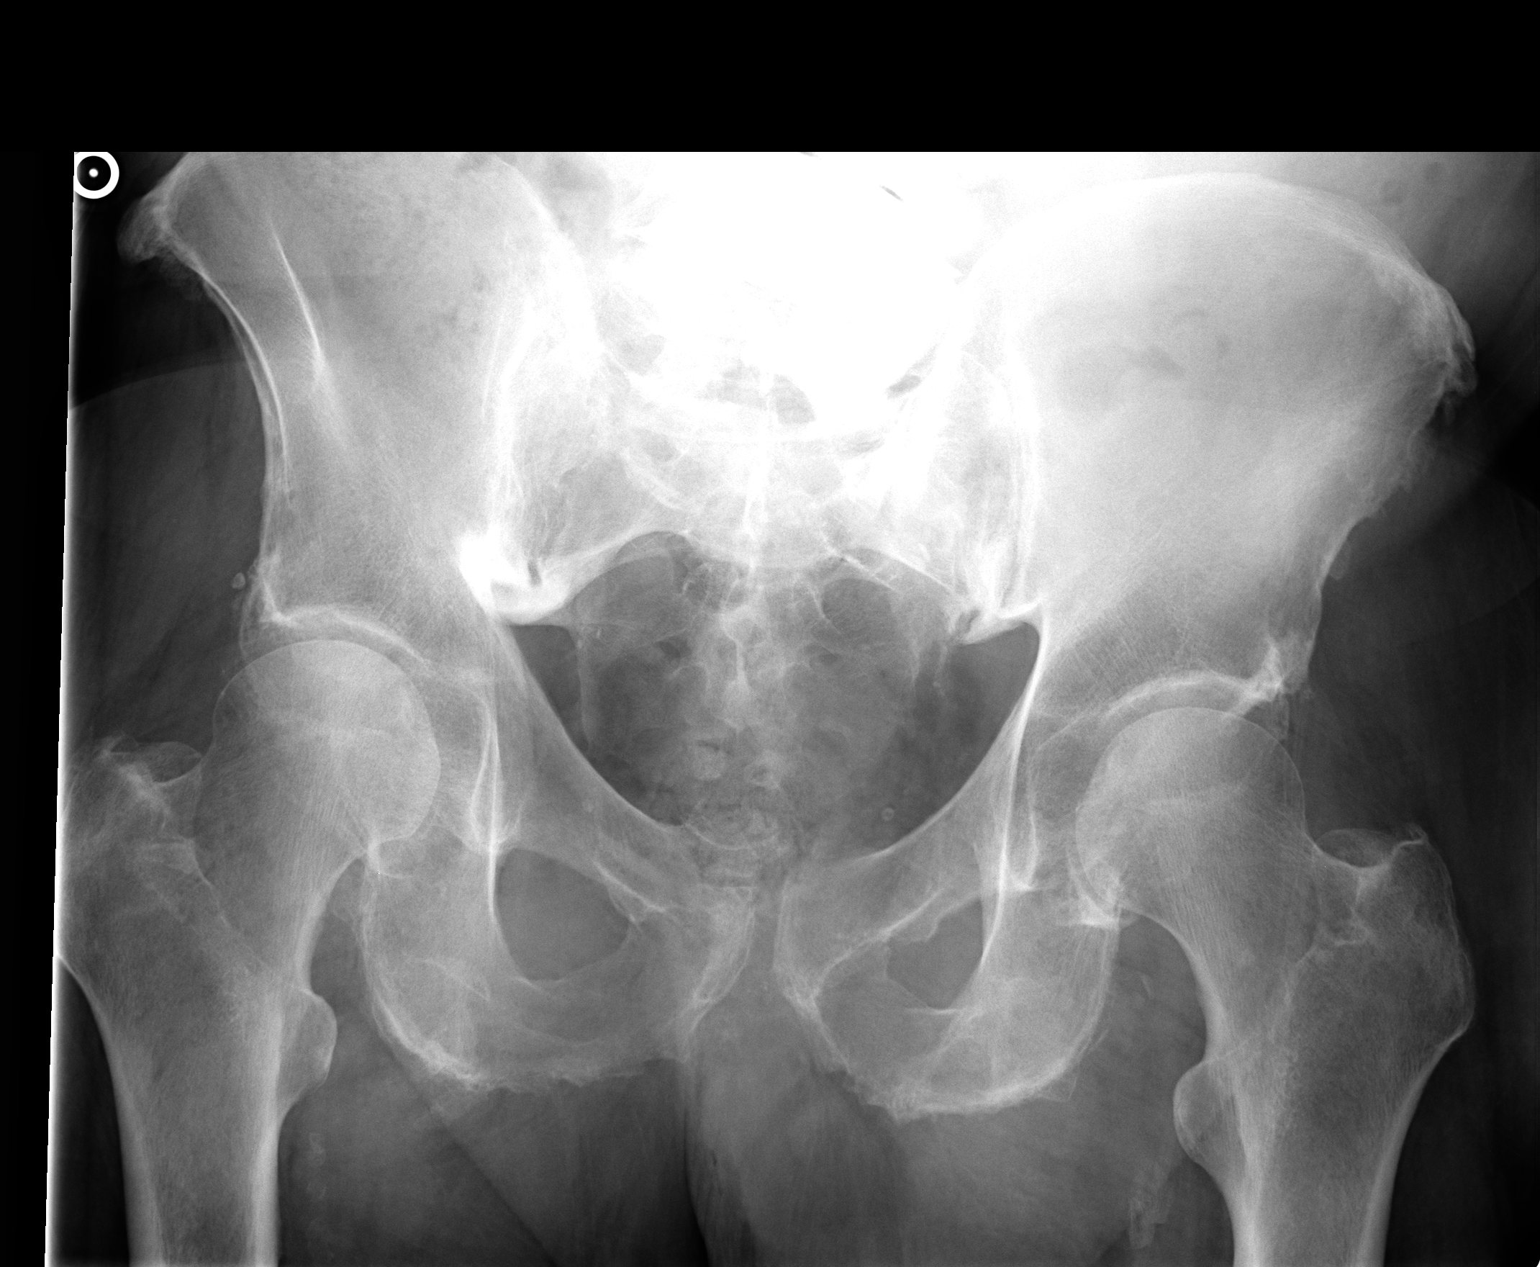

[view not recorded (2 of 2)]
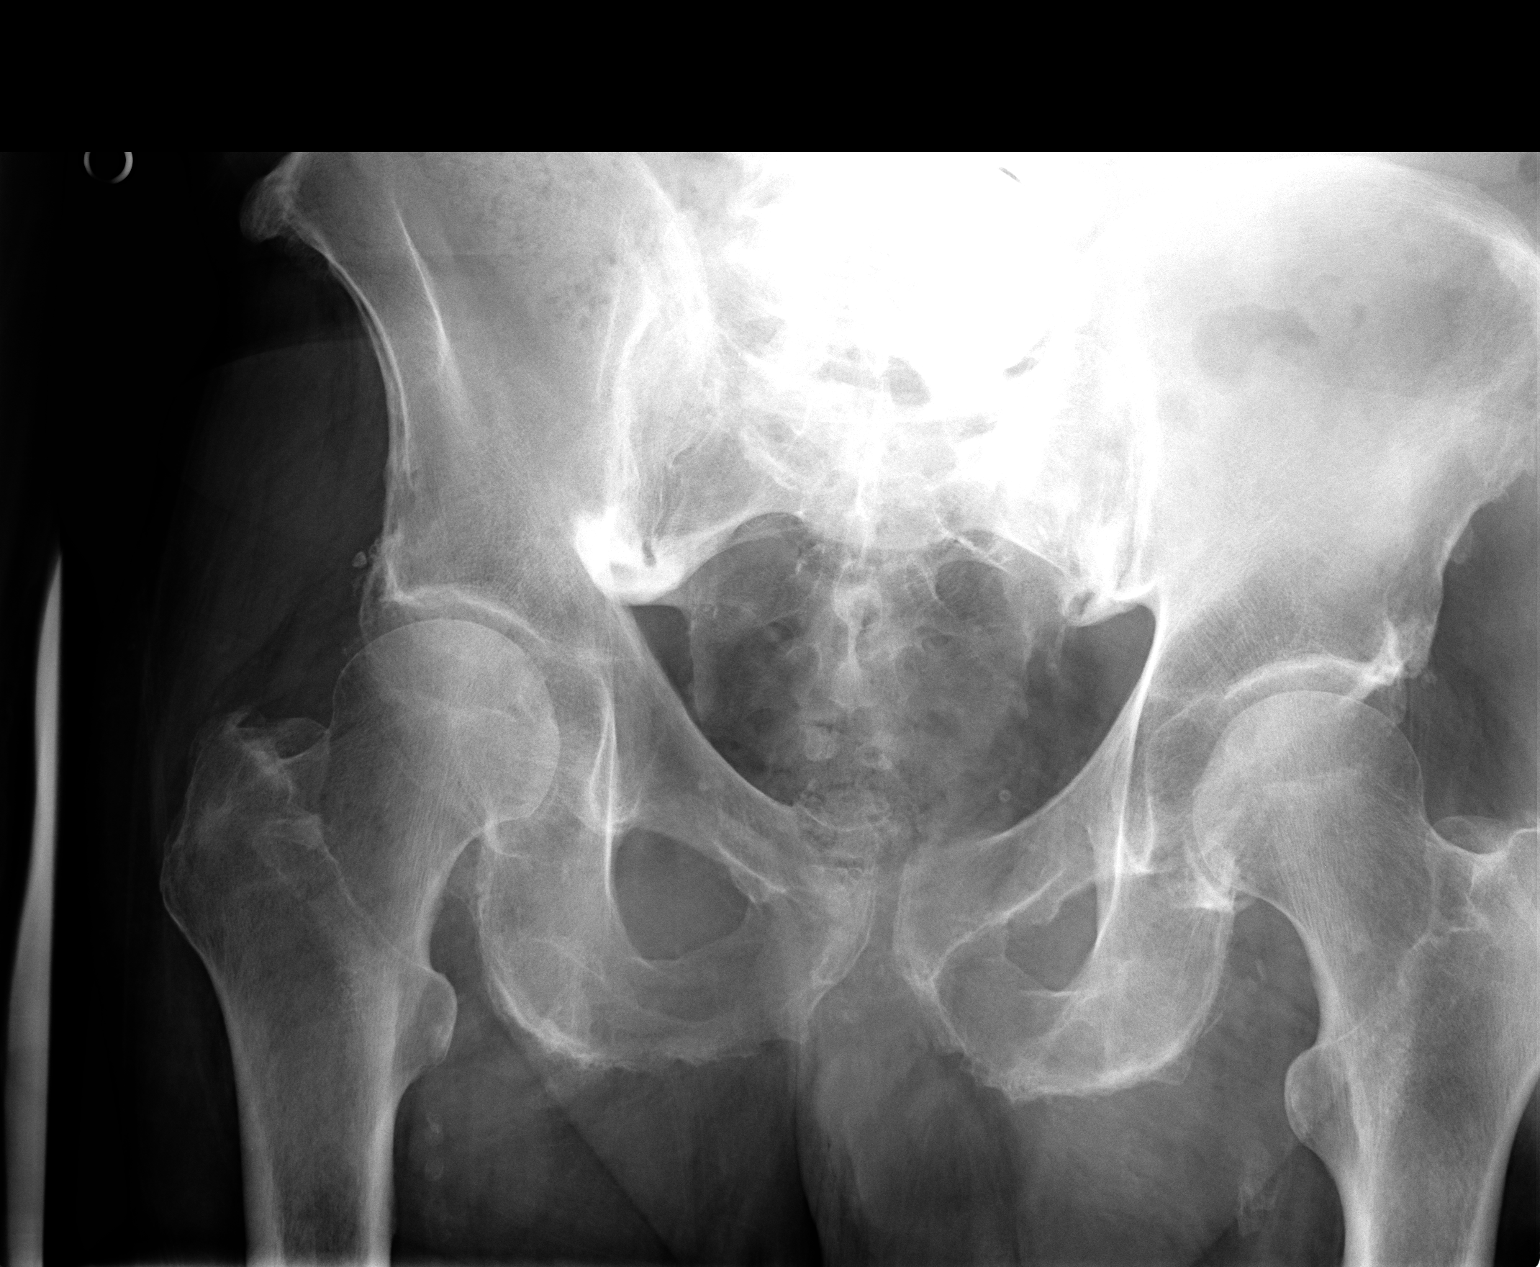

[2 of 2 positions shown; findings below may reference images not displayed]

FINDINGS: There is very little degenerative joint disease of the
hips for age.  There is degenerative change in the lower lumbar
spine and involving both SI joints.  The pelvic rami are intact.
IMPRESSION: Degenerative change in the lower lumbar spine and in the SI joints.
Both hips show only minimal degenerative change.

## 2016-10-20 ENCOUNTER — Encounter (HOSPITAL_COMMUNITY)
Admission: RE | Admit: 2016-10-20 | Discharge: 2016-10-20 | Disposition: A | Discharge status: Home / Self Care | Payer: Self-pay | Source: Ambulatory Visit | Attending: Pulmonary Disease | Admitting: Pulmonary Disease

## 2016-10-20 ENCOUNTER — Encounter (HOSPITAL_COMMUNITY): Payer: Self-pay

## 2016-10-20 VITALS — BP 167/70 | HR 107 | Ht 70.0 in | Wt 206.6 lb

## 2016-10-20 DIAGNOSIS — J449 Chronic obstructive pulmonary disease, unspecified: Secondary | ICD-10-CM

## 2016-10-20 HISTORY — DX: Cardiac arrhythmia, unspecified: I49.9

## 2016-10-20 NOTE — Progress Notes (Signed)
[name] 83 y.o. male Pulmonary Rehab Orientation Note Patient arrived today in [facility] for orientation to Pulmonary Rehab. [facility] via wheel chair. He does not carry portable oxygen. Per pt, he uses oxygen never. Color good, skin warm and dry. Patient is oriented to time and place. Patient's medical history, psychosocial health, and medications reviewed. Psychosocial assessment reveals pt lives with their spouse and she has dementia.  She can be left alone, but is extremely forgetful.  Pt is currently retired. 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  Pt hobbies include watching TV.  He was an avid golfer many years ago, but has had to give that up due to his health issues. Pt reports his stress level is high. Areas of stress/anxiety include Health Family.  Pt does not exhibit signs of depression. PHQ2/9 score 1/0. Pt shows fair  coping skills with positive outlook . Offered emotional support and reassurance. Will continue to monitor and evaluate to see if psychosocial issues arise.   Physical assessment reveals heart rate is irregular due to atrial fib and rate 100-110, breath sounds clear to auscultation, no wheezes, rales, or rhonchi, but he does have mucus in his upper airway, throat area.  Encouraged to cough up mucus and take a decongestant as ordered by his physician. Grip strength equal, strong. Distal pulses 1+ bilateral posterior tibial pulses present with 2+ pitting edema of ankles. He saw his PCP 2 days ago and his Lasix has been increased to 80 mg in the am, and 40 mg in the pm which he has not started.  Encouraged to start today, will reevaluate ankle edema on next visit to see if edema has improved.  Patient reports he does take all other medications as prescribed. Patient states he follows a Regular diet. I will give a low sodim handout on his next visit.  The patient reports no specific efforts to gain or lose weight.. Patient's weight will be  monitored closely. Demonstration and practice of PLB using pulse oximeter. Patient able to return demonstration satisfactorily. Safety and hand hygiene in the exercise area reviewed with patient. Patient voices understanding of the information reviewed. Department expectations discussed with patient and achievable goals were set. The patient shows enthusiasm about attending the program and we look forward to working with this nice veteran. The patient is scheduled for a 6 min walk test on Thursday, October 23, 2016 @ 3:30pm and to begin exercise on Thursday, October 30, 2016 in the 1:30 pm class.   [id number]

## 2016-10-23 ENCOUNTER — Encounter (HOSPITAL_COMMUNITY)
Admission: RE | Admit: 2016-10-23 | Discharge: 2016-10-23 | Disposition: A | Discharge status: Home / Self Care | Payer: Self-pay | Source: Ambulatory Visit | Attending: Pulmonary Disease | Admitting: Pulmonary Disease

## 2016-10-23 DIAGNOSIS — J449 Chronic obstructive pulmonary disease, unspecified: Secondary | ICD-10-CM

## 2016-10-27 NOTE — Progress Notes (Signed)
Patient arrived at Pulmonary Rehab on February 22nd for his 6MWT. Patient stated upon arrival that he felt like he could not do the program because where he was physically. He was very vague with his explanation but just kept saying he couldn't do the program at this time. He stated that he just hasn't been feeling well lately and if he started to feel better he would consider coming back. Will discharge patient.

## 2016-10-30 ENCOUNTER — Ambulatory Visit (HOSPITAL_COMMUNITY): Payer: Medicare Other

## 2016-10-30 ENCOUNTER — Encounter (HOSPITAL_COMMUNITY): Payer: Self-pay | Admitting: *Deleted

## 2016-11-04 ENCOUNTER — Ambulatory Visit (HOSPITAL_COMMUNITY): Payer: Medicare Other

## 2016-11-06 ENCOUNTER — Ambulatory Visit (HOSPITAL_COMMUNITY): Payer: Medicare Other

## 2016-11-11 ENCOUNTER — Ambulatory Visit (HOSPITAL_COMMUNITY): Payer: Medicare Other

## 2016-11-13 ENCOUNTER — Ambulatory Visit (HOSPITAL_COMMUNITY): Payer: Medicare Other

## 2016-11-18 ENCOUNTER — Ambulatory Visit (HOSPITAL_COMMUNITY): Payer: Medicare Other

## 2016-11-20 ENCOUNTER — Ambulatory Visit (HOSPITAL_COMMUNITY): Payer: Medicare Other

## 2016-11-24 NOTE — Addendum Note (Signed)
Encounter addended by: Drema PryJoan A Reianna Batdorf, RN on: 11/24/2016  2:12 PM<BR>    Actions taken: Sign clinical note, Episode resolved

## 2016-11-24 NOTE — Progress Notes (Signed)
Discharge Summary  Patient Details  Name: [name] MRN: [id number] Date of Birth: 23-Jan-1931 Referring Provider:     Number of Visits: 0  Reason for Discharge:  Early Exit:  patient felt the program was too rigorous for him to participate and only came to orientation  Smoking History:  History  Smoking Status  . Former Smoker  . Packs/day: 1.00  . Years: 40.00  . Types: Cigarettes  . Quit date: 10/20/1985  Smokeless Tobacco  . Never Used    Diagnosis:  Chronic obstructive pulmonary disease, unspecified COPD type (HCC)  ADL [facility]:     Pulmonary Assessment Scores    Row Name 10/30/16 1401         ADL [facility]   ADL Phase Entry     SOB Score total 105       CAT Score   CAT Score 28        Initial Exercise Prescription:   Discharge Exercise Prescription (Final Exercise Prescription Changes):   Functional Capacity:   Psychological, QOL, Others - Outcomes: PHQ 2/9: Depression screen PHQ 2/9 10/20/2016  Decreased Interest 1  Down, Depressed, Hopeless 0  PHQ - 2 Score 1    Quality of Life:     Quality of Life - 10/30/16 1401      Quality of Life Scores   Health/Function Pre 14.89 %   Socioeconomic Pre 21.14 %   Psych/Spiritual Pre 14.36 %   Family Pre 23.5 %   GLOBAL Pre 17.22 %      Personal Goals: Goals established at orientation with interventions provided to work toward goal.     Personal Goals and Risk Factors at Admission - 10/20/16 1134      Core Components/Risk Factors/Patient Goals on Admission   Sedentary Yes   Intervention Provide advice, education, support and counseling about physical activity/exercise needs.;Develop an individualized exercise prescription for aerobic and resistive training based on initial evaluation findings, risk stratification, comorbidities and participant's personal goals.   Expected Outcomes Achievement of increased cardiorespiratory fitness and enhanced flexibility, muscular endurance and strength shown  through measurements of functional capacity and personal statement of participant.   Increase Strength and Stamina Yes   Intervention Provide advice, education, support and counseling about physical activity/exercise needs.;Develop an individualized exercise prescription for aerobic and resistive training based on initial evaluation findings, risk stratification, comorbidities and participant's personal goals.   Expected Outcomes Achievement of increased cardiorespiratory fitness and enhanced flexibility, muscular endurance and strength shown through measurements of functional capacity and personal statement of participant.   Improve shortness of breath with ADL's Yes   Intervention Provide education, individualized exercise plan and daily activity instruction to help decrease symptoms of SOB with activities of daily living.   Expected Outcomes Short Term: Achieves a reduction of symptoms when performing activities of daily living.   Develop more efficient breathing techniques such as purse lipped breathing and diaphragmatic breathing; and practicing self-pacing with activity Yes   Increase knowledge of respiratory medications and ability to use respiratory devices properly  Yes   Intervention Provide education and demonstration as needed of appropriate use of medications, inhalers, and oxygen therapy.   Expected Outcomes Short Term: Achieves understanding of medications use. Understands that oxygen is a medication prescribed by physician. Demonstrates appropriate use of inhaler and oxygen therapy.       Personal Goals Discharge:     Goals and Risk Factor Review    Row Name 10/20/16 1139  Core Components/Risk Factors/Patient Goals Review   Personal Goals Review Sedentary;Develop more efficient breathing techniques such as purse lipped breathing and diaphragmatic breathing and practicing self-pacing with activity.;Increase Strength and Stamina;Improve shortness of breath with  ADL's;Increase knowledge of respiratory medications and ability to use respiratory devices properly.          Nutrition & Weight - Outcomes:     Pre Biometrics - 10/20/16 1134      Pre Biometrics   Grip Strength 31 kg       Nutrition:   Nutrition Discharge:     Nutrition Assessments - 10/30/16 0908      Rate Your Plate Scores   Pre Score 39      Education Questionnaire Score:     Knowledge Questionnaire Score - 10/30/16 1400      Knowledge Questionnaire Score   Pre Score 9/13      Goals reviewed with patient; copy given to patient.

## 2016-11-25 ENCOUNTER — Ambulatory Visit (HOSPITAL_COMMUNITY): Payer: Medicare Other

## 2016-11-27 ENCOUNTER — Ambulatory Visit (HOSPITAL_COMMUNITY): Payer: Medicare Other

## 2016-11-30 DEATH — deceased

## 2016-12-02 ENCOUNTER — Ambulatory Visit (HOSPITAL_COMMUNITY): Payer: Medicare Other

## 2016-12-04 ENCOUNTER — Ambulatory Visit (HOSPITAL_COMMUNITY): Payer: Medicare Other

## 2016-12-09 ENCOUNTER — Ambulatory Visit (HOSPITAL_COMMUNITY): Payer: Medicare Other

## 2016-12-11 ENCOUNTER — Ambulatory Visit (HOSPITAL_COMMUNITY): Payer: Medicare Other

## 2016-12-16 ENCOUNTER — Ambulatory Visit (HOSPITAL_COMMUNITY): Payer: Medicare Other

## 2016-12-18 ENCOUNTER — Ambulatory Visit (HOSPITAL_COMMUNITY): Payer: Medicare Other

## 2016-12-23 ENCOUNTER — Ambulatory Visit (HOSPITAL_COMMUNITY): Payer: Medicare Other

## 2016-12-25 ENCOUNTER — Ambulatory Visit (HOSPITAL_COMMUNITY): Payer: Medicare Other

## 2016-12-30 ENCOUNTER — Ambulatory Visit (HOSPITAL_COMMUNITY): Payer: Medicare Other

## 2017-01-01 ENCOUNTER — Ambulatory Visit (HOSPITAL_COMMUNITY): Payer: Medicare Other

## 2017-01-06 ENCOUNTER — Ambulatory Visit (HOSPITAL_COMMUNITY): Payer: Medicare Other

## 2017-01-08 ENCOUNTER — Ambulatory Visit (HOSPITAL_COMMUNITY): Payer: Medicare Other

## 2017-01-13 ENCOUNTER — Ambulatory Visit (HOSPITAL_COMMUNITY): Payer: Medicare Other

## 2017-01-15 ENCOUNTER — Ambulatory Visit (HOSPITAL_COMMUNITY): Payer: Medicare Other

## 2017-01-20 ENCOUNTER — Ambulatory Visit (HOSPITAL_COMMUNITY): Payer: Medicare Other

## 2017-01-22 ENCOUNTER — Ambulatory Visit (HOSPITAL_COMMUNITY): Payer: Medicare Other

## 2017-01-27 ENCOUNTER — Ambulatory Visit (HOSPITAL_COMMUNITY): Payer: Medicare Other

## 2017-01-29 ENCOUNTER — Ambulatory Visit (HOSPITAL_COMMUNITY): Payer: Medicare Other

## 2017-02-03 ENCOUNTER — Ambulatory Visit (HOSPITAL_COMMUNITY): Payer: Medicare Other

## 2017-02-05 ENCOUNTER — Ambulatory Visit (HOSPITAL_COMMUNITY): Payer: Medicare Other

## 2017-02-10 ENCOUNTER — Ambulatory Visit (HOSPITAL_COMMUNITY): Payer: Medicare Other

## 2017-02-12 ENCOUNTER — Ambulatory Visit (HOSPITAL_COMMUNITY): Payer: Medicare Other

## 2017-02-17 ENCOUNTER — Ambulatory Visit (HOSPITAL_COMMUNITY): Payer: Medicare Other

## 2017-02-19 ENCOUNTER — Ambulatory Visit (HOSPITAL_COMMUNITY): Payer: Medicare Other

## 2017-02-24 ENCOUNTER — Ambulatory Visit (HOSPITAL_COMMUNITY): Payer: Medicare Other

## 2017-02-26 ENCOUNTER — Ambulatory Visit (HOSPITAL_COMMUNITY): Payer: Medicare Other

## 2017-03-03 ENCOUNTER — Ambulatory Visit (HOSPITAL_COMMUNITY): Payer: Medicare Other

## 2017-03-05 ENCOUNTER — Ambulatory Visit (HOSPITAL_COMMUNITY): Payer: Medicare Other

## 2017-03-10 ENCOUNTER — Ambulatory Visit (HOSPITAL_COMMUNITY): Payer: Medicare Other
